# Patient Record
Sex: Female | Born: 1935 | Race: White | Hispanic: No | Smoking: Never smoker
Health system: Southern US, Community
[De-identification: ages and names within clinical notes are randomized; demographics above are authoritative.]

## PROBLEM LIST (undated history)

## (undated) DIAGNOSIS — E079 Disorder of thyroid, unspecified: Secondary | ICD-10-CM

## (undated) DIAGNOSIS — R569 Unspecified convulsions: Secondary | ICD-10-CM

## (undated) DIAGNOSIS — Z950 Presence of cardiac pacemaker: Secondary | ICD-10-CM

## (undated) DIAGNOSIS — I10 Essential (primary) hypertension: Secondary | ICD-10-CM

## (undated) DIAGNOSIS — M1990 Unspecified osteoarthritis, unspecified site: Secondary | ICD-10-CM

---

## 2013-04-16 ENCOUNTER — Encounter (HOSPITAL_BASED_OUTPATIENT_CLINIC_OR_DEPARTMENT_OTHER): Payer: Self-pay | Admitting: Emergency Medicine

## 2013-04-16 ENCOUNTER — Emergency Department (HOSPITAL_BASED_OUTPATIENT_CLINIC_OR_DEPARTMENT_OTHER): Payer: Medicare (Managed Care)

## 2013-04-16 ENCOUNTER — Emergency Department (HOSPITAL_BASED_OUTPATIENT_CLINIC_OR_DEPARTMENT_OTHER)
Admission: EM | Admit: 2013-04-16 | Discharge: 2013-04-16 | Disposition: A | Discharge status: Home / Self Care | Payer: Medicare (Managed Care) | Attending: Emergency Medicine | Admitting: Emergency Medicine

## 2013-04-16 DIAGNOSIS — Z79899 Other long term (current) drug therapy: Secondary | ICD-10-CM | POA: Insufficient documentation

## 2013-04-16 DIAGNOSIS — M129 Arthropathy, unspecified: Secondary | ICD-10-CM | POA: Insufficient documentation

## 2013-04-16 DIAGNOSIS — Z7982 Long term (current) use of aspirin: Secondary | ICD-10-CM | POA: Insufficient documentation

## 2013-04-16 DIAGNOSIS — W1809XA Striking against other object with subsequent fall, initial encounter: Secondary | ICD-10-CM | POA: Insufficient documentation

## 2013-04-16 DIAGNOSIS — S12600A Unspecified displaced fracture of seventh cervical vertebra, initial encounter for closed fracture: Secondary | ICD-10-CM | POA: Insufficient documentation

## 2013-04-16 DIAGNOSIS — S22009A Unspecified fracture of unspecified thoracic vertebra, initial encounter for closed fracture: Secondary | ICD-10-CM | POA: Insufficient documentation

## 2013-04-16 DIAGNOSIS — Y9389 Activity, other specified: Secondary | ICD-10-CM | POA: Insufficient documentation

## 2013-04-16 DIAGNOSIS — S22019A Unspecified fracture of first thoracic vertebra, initial encounter for closed fracture: Secondary | ICD-10-CM

## 2013-04-16 DIAGNOSIS — E079 Disorder of thyroid, unspecified: Secondary | ICD-10-CM | POA: Insufficient documentation

## 2013-04-16 DIAGNOSIS — Z950 Presence of cardiac pacemaker: Secondary | ICD-10-CM | POA: Insufficient documentation

## 2013-04-16 DIAGNOSIS — Y9289 Other specified places as the place of occurrence of the external cause: Secondary | ICD-10-CM | POA: Insufficient documentation

## 2013-04-16 DIAGNOSIS — S0990XA Unspecified injury of head, initial encounter: Secondary | ICD-10-CM | POA: Insufficient documentation

## 2013-04-16 DIAGNOSIS — I10 Essential (primary) hypertension: Secondary | ICD-10-CM | POA: Insufficient documentation

## 2013-04-16 DIAGNOSIS — G43909 Migraine, unspecified, not intractable, without status migrainosus: Secondary | ICD-10-CM | POA: Insufficient documentation

## 2013-04-16 HISTORY — DX: Presence of cardiac pacemaker: Z95.0

## 2013-04-16 HISTORY — DX: Unspecified convulsions: R56.9

## 2013-04-16 HISTORY — DX: Unspecified osteoarthritis, unspecified site: M19.90

## 2013-04-16 HISTORY — DX: Essential (primary) hypertension: I10

## 2013-04-16 HISTORY — DX: Disorder of thyroid, unspecified: E07.9

## 2013-04-16 MED ORDER — TRAMADOL HCL 50 MG PO TABS
50.0000 mg | ORAL_TABLET | Freq: Once | ORAL | Status: AC
  Administered 2013-04-16: 50 mg via ORAL
  Filled 2013-04-16: qty 1

## 2013-04-16 MED ORDER — HYDROCODONE-ACETAMINOPHEN 5-325 MG PO TABS: 1.0000 | ORAL_TABLET | ORAL | Status: AC | PRN

## 2013-04-16 MED ORDER — HYDROCODONE-ACETAMINOPHEN 5-325 MG PO TABS
1.0000 | ORAL_TABLET | Freq: Once | ORAL | Status: AC
  Administered 2013-04-16: 1 via ORAL
  Filled 2013-04-16: qty 1

## 2013-04-16 MED ORDER — TRAMADOL HCL 50 MG PO TABS: 50.0000 mg | ORAL_TABLET | Freq: Four times a day (QID) | ORAL | Status: AC | PRN

## 2013-04-16 NOTE — Discharge Instructions (Signed)
Cervical Spine Fracture, Stable  A cervical spine fracture is a break or crack in one of the bones of the neck. A fracture is stable if the chances of it causing you problems while it is healing are very small.  CAUSES   · Vehicle accidents.  · Injuries from sports such as diving, football, biking, wrestling, or skiing.  · Occasionally, severe osteoporosis or other bone diseases, such cancers that spread to bone or metabolic abnormalities.  SYMPTOMS   · Severe neck pain after an accident or fall.  · Pain down your shoulders or arms.  · Bruising or swelling on the back of your neck.  · Numbness, tingling, muscle spasm, or weakness.  DIAGNOSIS   Cervical spine fracture is diagnosed with the help of X-ray exams of your neck. Often a CT scan or MRI is used to confirm the diagnosis and help determine how your injury should be treated. Generally, an examination of your neck, arms and legs, and the history of your injury prompts the health care provider to order these tests.   TREATMENT   A stable fracture needs to be stabilized with a brace or cervical collar. A cervical collar is a two-piece collar designed to keep your neck from moving during the healing process.  HOME CARE INSTRUCTIONS  · Limit physical activity to prevent worsening of the fracture.  · Apply ice to areas of pain 3 4 times a day for 2 days.  · Put ice in a bag.  · Place a towel between your skin and the bag.  · Leave the ice on for 15 20 minutes, 3 4 times a day.  · You may have been given a cervical collar to wear.  · Do not remove the collar unless instructed by your health care provider.  · If you have long hair, keep it outside of the collar.  · Ask your health care provider before making any adjustments to your collar. Minor adjustments  may be required over time to improve comfort and reduce pressure on your chin or on back of your head.  · Keep your collar clean by wiping it with mild soap and water and drying it completely. The pads can be hand  washed with soap and water and air dried completely.  · If you are allowed to remove the collar for cleaning or bathing, follow your health care provider's instructions on how to do so safely.  · If you are allowed to remove the collar for cleaning and bathing, wash and dry the skin of your neck. Check your skin for irritation or sores. If you see any, tell your health care provider.  · Only take over-the-counter or prescription medicines for pain, discomfort, or fever as directed by your health care provider.    · Keep all follow-up appointments as directed by your health care provider. Not keeping an appointment could result in a chronic or permanent injury, pain, and disability. Additionally, X-rays or an MRI may be repeated 1 3 weeks after your initial appointment. This is to:  · Make sure any other breaks or cracks were not missed.    · Help identify stretched or torn ligaments.    · Get your test results if you did not get them when you were first evaluated. The results will determine whether you need other tests or treatment. It is your responsibility to get the results.  SEEK MEDICAL CARE IF:  You have irritation or sores on your skin from the cervical collar.  SEEK IMMEDIATE   MEDICAL CARE IF:   · You have increasing pain in your neck.    · You develop difficulties swallowing or breathing.  · You develop swelling in your neck.    · You have numbness, weakness, burning pain, or movement problems in the arms or legs.    · You are unable to control your bowel or bladder (incontinence).    · You have problems with coordination or difficulty walking.  MAKE SURE YOU:   · Understand these instructions.  · Will watch your condition.  · Will get help right away if you are not doing well or get worse.  Document Released: 02/13/2004 Document Revised: 11/28/2012 Document Reviewed: 10/22/2012  [employer]® Patient Information ©2014 [employer].

## 2013-04-16 NOTE — ED Notes (Signed)
Pt states fall occurred Friday, and didn't have pain that bothered her until Sunday.  Pain described as constant, and mostly focused from right neck/shoulder down right arm.  Pt describes "electric shock feeling" in right fingertips since yesterday.  Last took Naprosyn at about 1300 today, and states that the naprosyn taken over past few days as needed has helped each time to control the pain.

## 2013-04-16 NOTE — ED Notes (Signed)
Warm blanket given for comfort, patient's daughter at bedside.

## 2013-04-16 NOTE — ED Provider Notes (Signed)
CSN: [id number]     Arrival date & time 04/16/13  1509 History   First MD Initiated Contact with Patient 04/16/13 1837     This chart was scribed for Rolan Bucco, MD by Arlan Organ, ED Scribe. This patient was seen in room MH09/MH09 and the patient's care was started 7:24 PM.   Chief Complaint  Patient presents with  . Fall   Patient is a 80 y.o. female presenting with fall. The history is provided by the patient and a relative. No language interpreter was used.  Fall This is a recurrent problem. The current episode started more than 2 days ago. The problem occurs constantly. The problem has been gradually worsening. Associated symptoms include headaches. Pertinent negatives include no chest pain, no abdominal pain and no shortness of breath.    HPI Comments: Hannah Dyer is a 80 y.o. female who presents to the Emergency Department complaining of a fall that occurred 4 days ago. She states she was in the shower, lost her balance, and believes she hit her head on a hard surface. Pt now c/o gradual onset, gradually worsening, constant right arm and shoulder pain onset 2 days. She states the pain radiates all the way down her right arm. She says she periodically experiences a pain described as "an electric shock" down her right arm. She also reports pain to her neck.  She has tried OTC Tylenol and Aleve with temporary mild relief. Her last dose of Aleve was around 1 PM today. She denies any numbness in her hands. She denies currently being on any blood thinners, but says she takes a daily aspirin. Pt states she typically falls often due to issues she has with her knees. Daughter states a caregiver stays with her during the day while her and her husband are at work. Pt has a h/o seizures, HTN, and thyroid disease. She states that the fall today was one of her normal pulse. She states that she falls normally because she has problems controlling her balance. She denies any unusual symptoms such as  chest pain palpitations shortness of breath or dizziness.  PCP-Dr. Christian Mate Past Medical History  Diagnosis Date  . Seizures   . Arthritis   . Hypertension   . Pacemaker   . Thyroid disease    No past surgical history on file. No family history on file. History  Substance Use Topics  . Smoking status: Never Smoker   . Smokeless tobacco: Not on file  . Alcohol Use: No   OB History   Grav Para Term Preterm Abortions TAB SAB Ect Mult Living                 Review of Systems  Constitutional: Negative for fever, chills, diaphoresis and fatigue.  HENT: Negative for congestion, rhinorrhea and sneezing.   Eyes: Negative.   Respiratory: Negative for cough, chest tightness and shortness of breath.   Cardiovascular: Negative for chest pain and leg swelling.  Gastrointestinal: Negative for nausea, vomiting, abdominal pain, diarrhea and blood in stool.  Genitourinary: Negative for frequency, hematuria, flank pain and difficulty urinating.  Musculoskeletal: Positive for arthralgias and neck pain. Negative for back pain.  Skin: Negative for rash.  Neurological: Positive for headaches. Negative for dizziness, speech difficulty, weakness and numbness.    Allergies  Review of patient's allergies indicates no known allergies.  Home Medications   Current Outpatient Rx  Name  Route  Sig  Dispense  Refill  . acetaminophen (TYLENOL) 650 MG  CR tablet   Oral   Take 650 mg by mouth every 8 (eight) hours as needed for pain.         Marland Kitchen aspirin 325 MG tablet   Oral   Take 325 mg by mouth daily.         . calcium-vitamin D (OSCAL WITH D) 500-200 MG-UNIT per tablet   Oral   Take 1 tablet by mouth.         . diltiazem (CARDIZEM CD) 180 MG 24 hr capsule   Oral   Take 180 mg by mouth daily.         . diphenhydrAMINE (SOMINEX) 25 MG tablet   Oral   Take 25 mg by mouth at bedtime as needed for sleep.         . enalapril (VASOTEC) 10 MG tablet   Oral   Take 10  mg by mouth daily.         Marland Kitchen gabapentin (NEURONTIN) 300 MG capsule   Oral   Take 300 mg by mouth 3 (three) times daily.         Marland Kitchen levETIRAcetam (KEPPRA) 250 MG tablet   Oral   Take 250 mg by mouth 2 (two) times daily.         . Levothyroxine Sodium (SYNTHROID PO)   Oral   Take by mouth.         . lovastatin (MEVACOR) 40 MG tablet   Oral   Take 40 mg by mouth at bedtime.         . metoprolol succinate (TOPROL-XL) 25 MG 24 hr tablet   Oral   Take 25 mg by mouth daily.         . Multiple Vitamin (MULTIVITAMIN) capsule   Oral   Take 1 capsule by mouth daily.         . sotalol (BETAPACE) 80 MG tablet   Oral   Take 80 mg by mouth 2 (two) times daily.         Marland Kitchen HYDROcodone-acetaminophen (NORCO/VICODIN) 5-325 MG per tablet   Oral   Take 1 tablet by mouth every 4 (four) hours as needed.   30 tablet   0   . traMADol (ULTRAM) 50 MG tablet   Oral   Take 1 tablet (50 mg total) by mouth every 6 (six) hours as needed.   30 tablet   0    Triage Vitals: BP 158/53  Pulse 60  Temp(Src) 97.7 F (36.5 C) (Oral)  Ht 5\' 7"  (1.702 m)  SpO2 100%  Physical Exam  Constitutional: She is oriented to person, place, and time. She appears well-developed and well-nourished.  HENT:  Head: Normocephalic.  Mild tenderness with underlying scalp hematoma to the left for head. There is no other facial tenderness noted. She has ecchymosis around the left eye.  Eyes: Conjunctivae and EOM are normal. Pupils are equal, round, and reactive to light.  Neck: Normal range of motion. Neck supple.  Cardiovascular: Normal rate, regular rhythm and normal heart sounds.   Pulmonary/Chest: Effort normal and breath sounds normal. No respiratory distress. She has no wheezes. She has no rales. She exhibits no tenderness.  Abdominal: Soft. Bowel sounds are normal. There is no tenderness. There is no rebound and no guarding.  Musculoskeletal: Normal range of motion. She exhibits no edema.  She has  some tenderness to the posterior aspect of the right shoulder. She is markedly tender over the lower cervical spine. There is no significant pain to  the thoracic or lumbosacral spine. No step-offs or deformities are noted. There is no pain to the right elbow or wrist. She has normal pulses in the right hand. Normal sensation in the right hand. There is no other pain on palpation or range of motion extremities.  Lymphadenopathy:    She has no cervical adenopathy.  Neurological: She is alert and oriented to person, place, and time.  Motor 5 out of 5 all extremities. Sensation grossly intact to light touch all extremities.  Skin: Skin is warm and dry. No rash noted.  Psychiatric: She has a normal mood and affect.    ED Course  Procedures (including critical care time)  DIAGNOSTIC STUDIES: Oxygen Saturation is 100% on RA, Normal by my interpretation.    COORDINATION OF CARE: 7:42 PM- Will order X-Rays. Will give pain medication. Discussed treatment plan with pt at bedside and pt agreed to plan.     Labs Review Labs Reviewed - No data to display Imaging Review Dg Shoulder Right  04/16/2013   CLINICAL DATA:  Left shoulder discomfort status post fall possible upper extremity radicular type symptoms  EXAM: RIGHT SHOULDER - 2+ VIEW  COMPARISON:  None  FINDINGS: The bones of the shoulder appear adequately mineralized for age. There is no evidence of an acute fracture nor dislocation. The observed portions of the scapula, right clavicle, and upper right ribs appear normal. Calcified nodules within the right upper lung likely reflect previous granulomatous infection.  IMPRESSION: There is no evidence of an acute fracture nor dislocation of the right shoulder port. No more than mild degenerative changes demonstrated. There is evidence of previous granulomatous infection of the right lung.   Electronically Signed   By: David  SwazilandJordan   On: 04/16/2013 20:29   Ct Head Wo Contrast  04/16/2013   CLINICAL DATA:   Fall injury 4 days ago, struck head, left eye bruising, neck pain, history hypertension, seizures, pacemaker  EXAM: CT HEAD WITHOUT CONTRAST  CT CERVICAL SPINE WITHOUT CONTRAST  TECHNIQUE: Multidetector CT imaging of the head and cervical spine was performed following the standard protocol without intravenous contrast. Multiplanar CT image reconstructions of the cervical spine were also generated.  COMPARISON:  None  FINDINGS: CT HEAD FINDINGS  Generalized atrophy.  Normal ventricular morphology.  No midline shift or mass effect.  Small vessel chronic ischemic changes of deep cerebral white matter.  No intracranial hemorrhage, mass lesion, or acute infarction.  Visualized paranasal sinuses and mastoid air cells clear.  Subtle lucencies are identified at the anterior aspect of the greater wing of the left sphenoid, cannot exclude a lytic process.  Tiny left frontal scalp hematoma.  CT CERVICAL SPINE FINDINGS  Mild scattered disc space narrowing.  Multilevel facet degenerative changes bilaterally.  Prevertebral soft tissues normal thickness.  Vertebral body heights maintained.  Visualized skullbase intact.  Scattered atherosclerotic calcifications carotid systems.  Questionable small nodular density at left apex 4 mm diameter.  Tiny nondisplaced fracture identified at the tip of the superior right articular process of T1.  In addition, nondisplaced fracture is seen extending from the cranial aspect of the spinous process of C7 obliquely through the right lamina of C7.  IMPRESSION: No acute intracranial abnormalities.  Atrophy with small vessel chronic ischemic changes of deep cerebral white matter.  Questionable lytic process at the greater ring of the left sphenoid.  Nondisplaced fracture at tip of right superior articular process T1.  Additional nondisplaced oblique fracture through the right lamina of C7 to the spinous  process of C7.  Critical Value/emergent results were called by telephone at the time of  interpretation on 04/16/2013 at 8:36 PM to Dr. Shawna Orleans Maddoxx Burkitt , who verbally acknowledged these results.   Electronically Signed   By: Ulyses Southward M.D.   On: 04/16/2013 20:39   Ct Cervical Spine Wo Contrast  04/16/2013   CLINICAL DATA:  Fall injury 4 days ago, struck head, left eye bruising, neck pain, history hypertension, seizures, pacemaker  EXAM: CT HEAD WITHOUT CONTRAST  CT CERVICAL SPINE WITHOUT CONTRAST  TECHNIQUE: Multidetector CT imaging of the head and cervical spine was performed following the standard protocol without intravenous contrast. Multiplanar CT image reconstructions of the cervical spine were also generated.  COMPARISON:  None  FINDINGS: CT HEAD FINDINGS  Generalized atrophy.  Normal ventricular morphology.  No midline shift or mass effect.  Small vessel chronic ischemic changes of deep cerebral white matter.  No intracranial hemorrhage, mass lesion, or acute infarction.  Visualized paranasal sinuses and mastoid air cells clear.  Subtle lucencies are identified at the anterior aspect of the greater wing of the left sphenoid, cannot exclude a lytic process.  Tiny left frontal scalp hematoma.  CT CERVICAL SPINE FINDINGS  Mild scattered disc space narrowing.  Multilevel facet degenerative changes bilaterally.  Prevertebral soft tissues normal thickness.  Vertebral body heights maintained.  Visualized skullbase intact.  Scattered atherosclerotic calcifications carotid systems.  Questionable small nodular density at left apex 4 mm diameter.  Tiny nondisplaced fracture identified at the tip of the superior right articular process of T1.  In addition, nondisplaced fracture is seen extending from the cranial aspect of the spinous process of C7 obliquely through the right lamina of C7.  IMPRESSION: No acute intracranial abnormalities.  Atrophy with small vessel chronic ischemic changes of deep cerebral white matter.  Questionable lytic process at the greater ring of the left sphenoid.  Nondisplaced  fracture at tip of right superior articular process T1.  Additional nondisplaced oblique fracture through the right lamina of C7 to the spinous process of C7.  Critical Value/emergent results were called by telephone at the time of interpretation on 04/16/2013 at 8:36 PM to Dr. Shawna Orleans Tyreisha Ungar , who verbally acknowledged these results.   Electronically Signed   By: Ulyses Southward M.D.   On: 04/16/2013 20:39    EKG Interpretation   None       MDM   1. C7 cervical fracture, closed, initial encounter   2. T1 vertebral fracture    Patient's films were reviewed by Dr. Conchita Paris and he feels that the fracture is stable and the patient can be discharged in an [place] collar. She has normal sensation and motor function in the upper extremities. She lives at home with her daughter who is here in the ED with the patient. I explained to the daughter that she's been likely need more assistance ambulating in the collar. I gave her a prescription for Ultram to use for pain. The daughter was concerned that might not be strong enough for the pain in a daycare her prescription for Vicodin as well. However I advised him just to use one or the other. I advised them to followup and the neurosurgeons office within 2 weeks for recheck.  I personally performed the services described in this documentation, which was scribed in my presence.  The recorded information has been reviewed and considered.   Rolan Bucco, MD 04/16/13 2133

## 2013-04-16 NOTE — ED Notes (Signed)
Fell x 5-6 days ago .  Struck forehead which now has a lg bruised area and left eye also bruised.  Denies loss of consciousness.  inj to rt knee and rt arm/shoulder

## 2013-04-16 NOTE — ED Notes (Signed)
Pt states that she takes her BP meds at night time, instructed to go home and take night time med dose. Denies CP, MD aware of plan of care and agrees with plan.

## 2013-04-16 NOTE — ED Notes (Signed)
fellx 5-6 days ago

## 2013-06-09 DEATH — deceased

## 2015-06-18 IMAGING — CT CT CERVICAL SPINE W/O CM
3 of 5 series · 10 of 34 positions shown, 12 images · non-contrast
Comparison: None

CLINICAL DATA: Fall injury 4 days ago, struck head, left eye
bruising, neck pain, history hypertension, seizures, pacemaker

EXAM:
CT HEAD WITHOUT CONTRAST
CT CERVICAL SPINE WITHOUT CONTRAST
TECHNIQUE: Multidetector CT imaging of the head and cervical spine was
performed following the standard protocol without intravenous
contrast. Multiplanar CT image reconstructions of the cervical spine
were also generated.

[Series 5: c_spine 2.0 b41s st · axial · 0.26mm/px · z∈[-232,-168]mm · 2 of 80 slices shown, 3 images]
[im 32/80  soft-tissue]
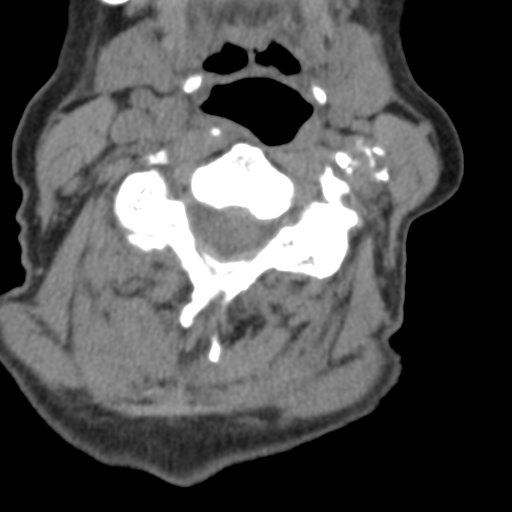
[im 32/80  bone]
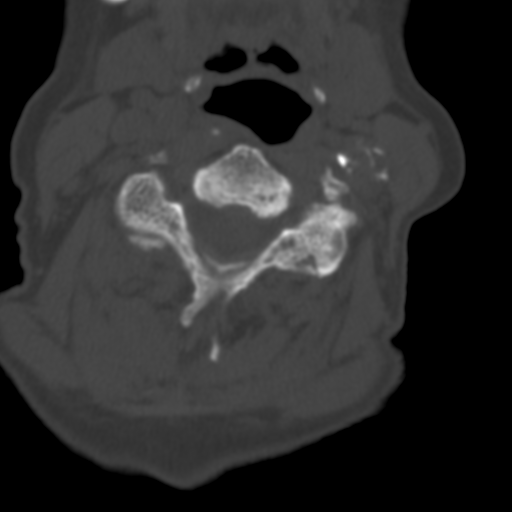
[im 64/80  bone]
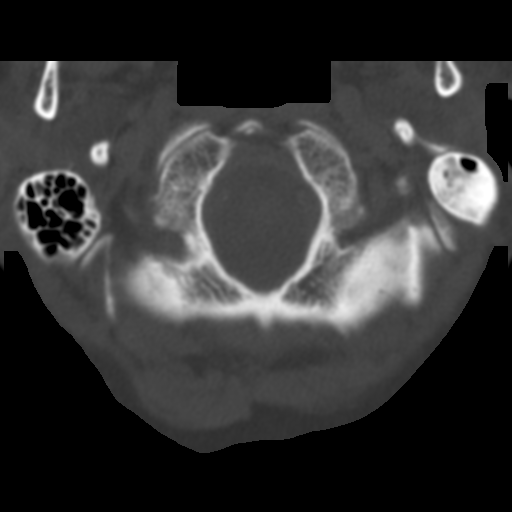

[Series 8: c_spine 2.0 coronal · coronal · 0.19mm/px · 3 of 35 slices shown]
[im 7/35  bone]
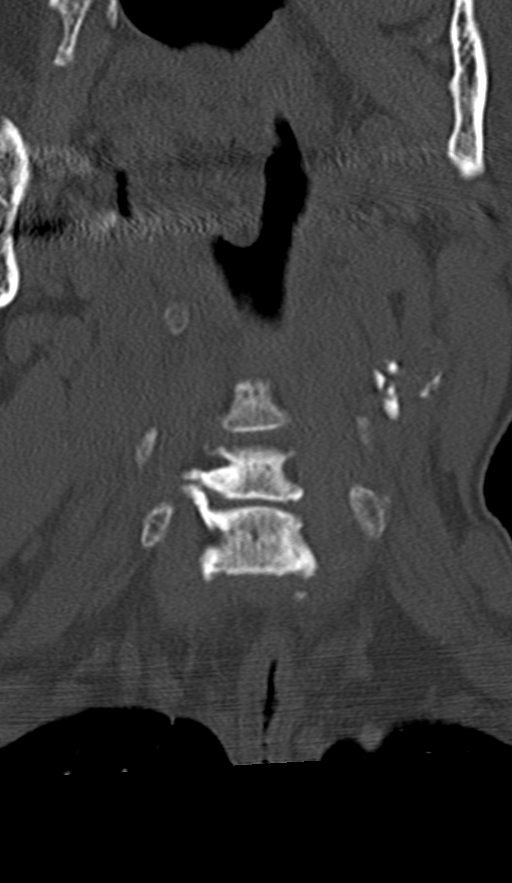
[im 14/35  bone]
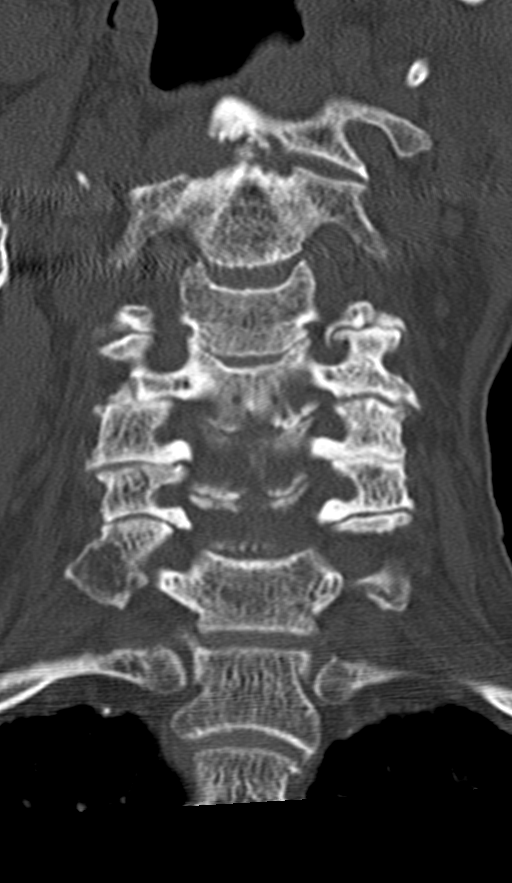
[im 21/35  bone]
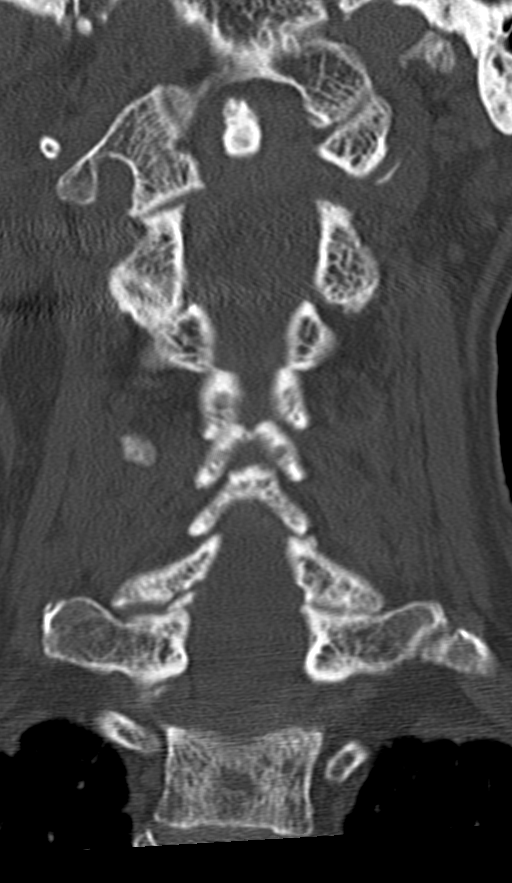

[Series 9: c_spine 2.0 sagittal · sagittal · 0.18mm/px · 5 of 39 slices shown, 6 images]
[im 13/39  bone]
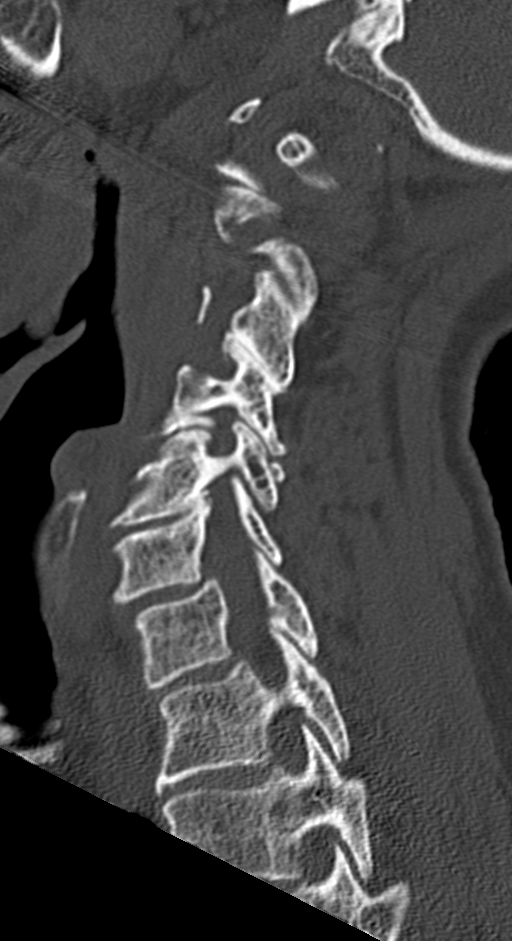
[im 16/39  bone]
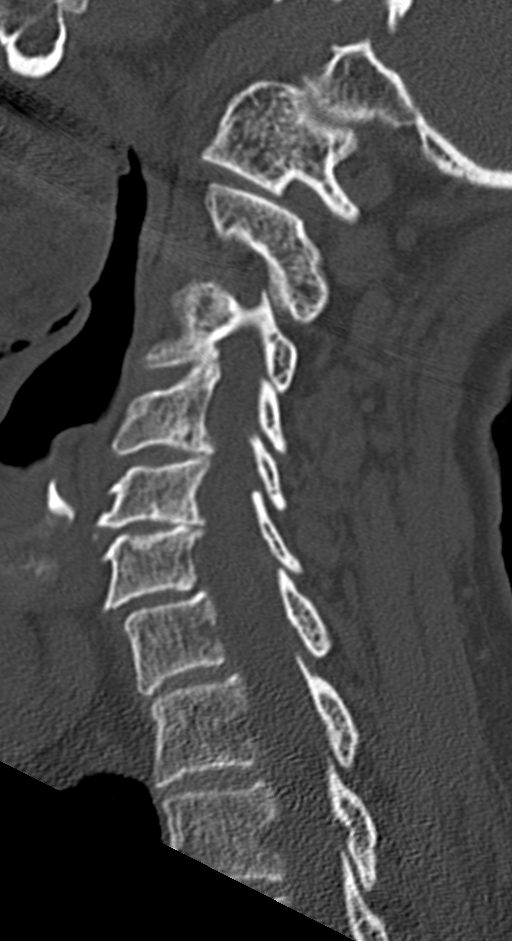
[im 20/39  soft-tissue]
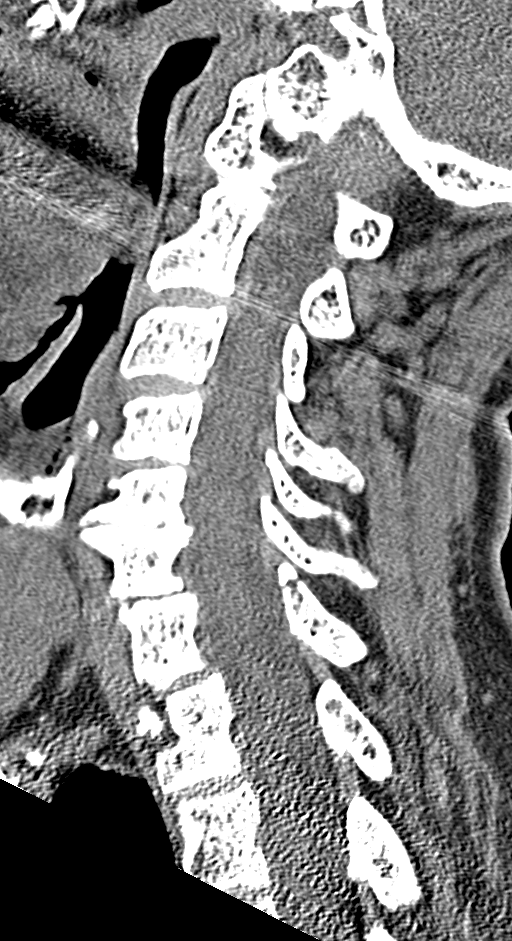
[im 20/39  bone]
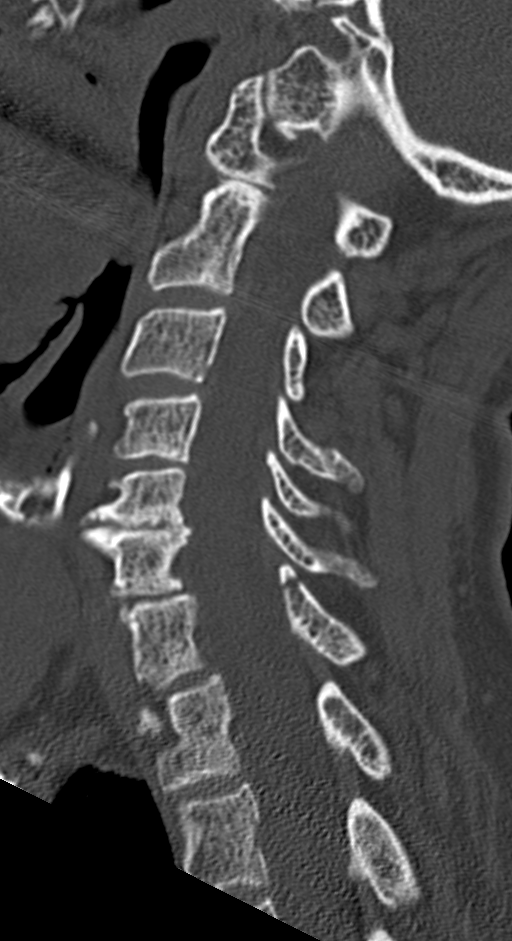
[im 23/39  bone]
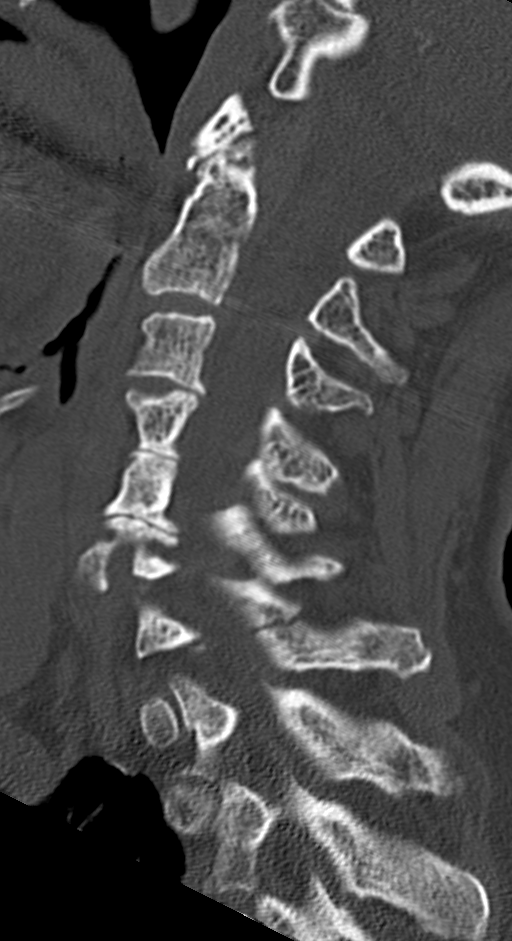
[im 26/39  bone]
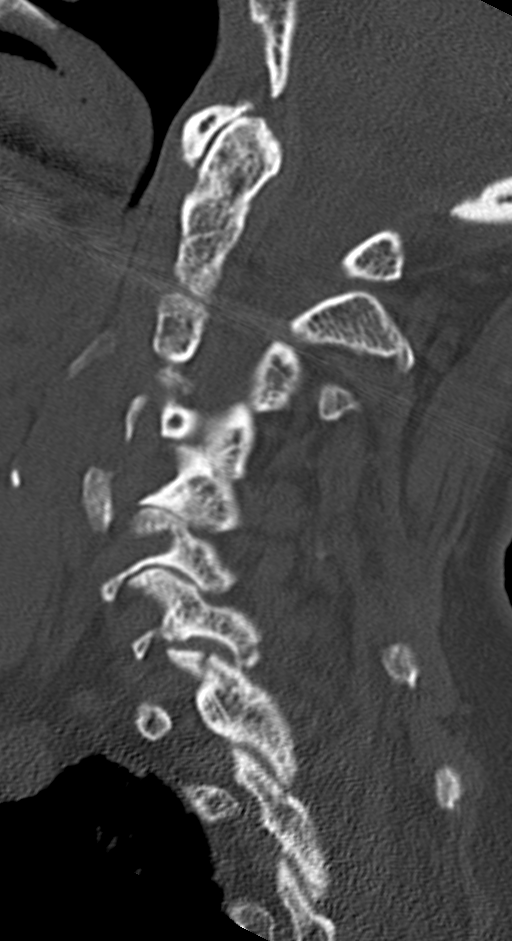

[10 of 34 positions shown; findings below may reference images not displayed]

FINDINGS: CT HEAD FINDINGS

Generalized atrophy.

Normal ventricular morphology.

No midline shift or mass effect.

Small vessel chronic ischemic changes of deep cerebral white matter.

No intracranial hemorrhage, mass lesion, or acute infarction.

Visualized paranasal sinuses and mastoid air cells clear.

Subtle lucencies are identified at the anterior aspect of the
greater wing of the left sphenoid, cannot exclude a lytic process.

Tiny left frontal scalp hematoma.

CT CERVICAL SPINE FINDINGS

Mild scattered disc space narrowing.

Multilevel facet degenerative changes bilaterally.

Prevertebral soft tissues normal thickness.

Vertebral body heights maintained.

Visualized skullbase intact.

Scattered atherosclerotic calcifications carotid systems.

Questionable small nodular density at left apex 4 mm diameter.

Tiny nondisplaced fracture identified at the tip of the superior
right articular process of T1.

In addition, nondisplaced fracture is seen extending from the
cranial aspect of the spinous process of C7 obliquely through the
right lamina of C7.
IMPRESSION: No acute intracranial abnormalities.

Atrophy with small vessel chronic ischemic changes of deep cerebral
white matter.

Questionable lytic process at the greater ring of the left sphenoid.

Nondisplaced fracture at tip of right superior articular process T1.

Additional nondisplaced oblique fracture through the right lamina of
C7 to the spinous process of C7.

Critical Value/emergent results were called by telephone at the time
of interpretation on 04/16/2013 at [DATE] to Dr. DEEQA RAYAAN ADLAHO , who
verbally acknowledged these results.
